# Patient Record
Sex: Female | Born: 1995 | Race: Black or African American | Hispanic: No | Marital: Single | Smoking: Current some day smoker
Health system: Southern US, Community
[De-identification: ages and names within clinical notes are randomized; demographics above are authoritative.]

---
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
## 2017-09-18 ENCOUNTER — Emergency Department (HOSPITAL_COMMUNITY)
Admission: EM | Admit: 2017-09-18 | Discharge: 2017-09-18 | Disposition: A | Discharge status: Home / Self Care | Payer: Self-pay | Attending: Emergency Medicine | Admitting: Emergency Medicine

## 2017-09-18 ENCOUNTER — Encounter (HOSPITAL_COMMUNITY): Payer: Self-pay | Admitting: *Deleted

## 2017-09-18 ENCOUNTER — Other Ambulatory Visit: Payer: Self-pay

## 2017-09-18 DIAGNOSIS — R0981 Nasal congestion: Secondary | ICD-10-CM | POA: Insufficient documentation

## 2017-09-18 DIAGNOSIS — R42 Dizziness and giddiness: Secondary | ICD-10-CM | POA: Insufficient documentation

## 2017-09-18 DIAGNOSIS — R110 Nausea: Secondary | ICD-10-CM | POA: Insufficient documentation

## 2017-09-18 DIAGNOSIS — J3489 Other specified disorders of nose and nasal sinuses: Secondary | ICD-10-CM | POA: Insufficient documentation

## 2017-09-18 DIAGNOSIS — J029 Acute pharyngitis, unspecified: Secondary | ICD-10-CM | POA: Insufficient documentation

## 2017-09-18 DIAGNOSIS — R0989 Other specified symptoms and signs involving the circulatory and respiratory systems: Secondary | ICD-10-CM | POA: Insufficient documentation

## 2017-09-18 MED ORDER — PSEUDOEPHEDRINE HCL 30 MG PO TABS: 30.0000 mg | ORAL_TABLET | ORAL | 0 refills | Status: AC | PRN

## 2017-09-18 NOTE — Discharge Instructions (Signed)
Your exam was reassuring today.   Please drink plenty of fluids at home and get rest this weekend. I have written you a prescription for Sudafed to help with your congestion.   Return to the ER if you have vomiting that will not stop, develop a fever or pass out.

## 2017-09-18 NOTE — ED Triage Notes (Signed)
Pt states upper resp symptoms x 1 week.  STates today began feeling nauseated at work, so came here.

## 2017-09-18 NOTE — ED Provider Notes (Signed)
[facility] Provider Note   CSN: [id number] Arrival date & time: 09/18/17  1248     History   Chief Complaint Chief Complaint  Patient presents with  . URI  . Nausea    HPI Melissa Estes is a 22 y.o. female.  HPI   Melissa Estes is a 22 year old female with no ischemic past medical history who presents to the emergency department for evaluation of runny nose nausea and lightheadedness.  Patient states that she has had a cold for the past five days.  Endorses nasal congestion and rhinorrhea with a mild scratchy sore throat.  She had a headache initially, but that has since resolved.  States that today while at work he felt lightheaded and nauseated.  Sat down and the symptoms resolved within a couple of minutes.  States that she has not had anything to eat today and had a [address], no other fluids.  At this time she denies fever, chills, headache, body aches, dysphagia, cough, shortness of breath, chest pain, abdominal pain, nausea/vomiting, lightheadedness, dizziness, dysuria.  States that she is sexually active with one female partner, denies regular condom use.  She declines pregnancy testing.   History reviewed. No pertinent past medical history.  There are no active problems to display for this patient.   History reviewed. No pertinent surgical history.  OB History    No data available       Home Medications    Prior to Admission medications   Medication Sig Start Date End Date Taking? Authorizing Provider  HYDROcodone-acetaminophen (NORCO/VICODIN) 5-325 MG tablet Take 2 tablets by mouth every 6 (six) hours as needed. Patient not taking: Reported on 05/30/2016 04/15/16   Arvilla MeresMeyer, Ashley L, PA-C  penicillin v potassium (VEETID) 500 MG tablet Take 1 tablet (500 mg total) by mouth 4 (four) times daily. Patient not taking: Reported on 05/30/2016 04/15/16   Deborha PaymentMeyer, Ashley L, PA-C  PRESCRIPTION MEDICATION Take 1 tablet by mouth daily. Patient  is on birth control but does not know the name,[facility] and couldn't get an  answer    [provider]    Family History No family history on file.  Social History Social History   Tobacco Use  . Smoking status: Never Smoker  . Smokeless tobacco: Never Used  Substance Use Topics  . Alcohol use: No  . Drug use: No     Allergies   Patient has no known allergies.   Review of Systems Review of Systems  Constitutional: Negative for chills and fever.  HENT: Positive for congestion, rhinorrhea and sore throat. Negative for ear pain, sinus pressure, sinus pain and trouble swallowing.   Eyes: Negative for visual disturbance.  Respiratory: Negative for cough and shortness of breath.   Cardiovascular: Negative for chest pain.  Gastrointestinal: Negative for abdominal pain, nausea and vomiting.  Genitourinary: Negative for dysuria and frequency.  Musculoskeletal: Negative for gait problem and myalgias.  Skin: Negative for rash.  Neurological: Negative for dizziness, syncope, light-headedness, numbness and headaches.     Physical Exam Updated Vital Signs BP (!) 144/109   Pulse 81   Temp 97.9 F (36.6 C) (Oral)   Resp 17   Ht 5\' 8"  (1.727 m)   Wt 59 kg (130 lb)   LMP 09/10/2017   SpO2 100%   BMI 19.77 kg/m   Physical Exam  Constitutional: She is oriented to person, place, and time. She appears well-developed and well-nourished. No distress.  HENT:  Head: Normocephalic and atraumatic.  Bilateral TMs with good cone of light.  No tenderness over maxillary or frontal sinuses.  Clear rhinorrhea in bilateral nares.  Mucous membranes moist.  Posterior oropharynx without erythema.  No tonsillar edema or exudate.  Uvula midline.  Airway patent.  Eyes: Conjunctivae and EOM are normal. Pupils are equal, round, and reactive to light. Right eye exhibits no discharge. Left eye exhibits no discharge.  Neck: Normal range of motion. Neck supple.    Cardiovascular: Normal rate, regular rhythm and intact distal pulses. Exam reveals no friction rub.  No murmur heard. Pulmonary/Chest: Effort normal. No stridor. No respiratory distress. She has no wheezes. She has no rales.  Abdominal: Soft. Bowel sounds are normal. There is no tenderness. There is no guarding.  Musculoskeletal: Normal range of motion.  Lymphadenopathy:    She has no cervical adenopathy.  Neurological: She is alert and oriented to person, place, and time. Coordination normal.  Mental Status:  Alert, oriented, thought content appropriate, able to give a coherent history. Speech fluent without evidence of aphasia. Able to follow 2 step commands without difficulty.  Cranial Nerves:  II:  Peripheral visual fields grossly normal, pupils equal, round, reactive to light III,IV, VI: ptosis not present, extra-ocular motions intact bilaterally  V,VII: smile symmetric, facial light touch sensation equal VIII: hearing grossly normal to voice  X: uvula elevates symmetrically  XI: bilateral shoulder shrug symmetric and strong XII: midline tongue extension without fassiculations Motor:  Normal tone. 5/5 in upper and lower extremities bilaterally including strong and equal grip strength and dorsiflexion/plantar flexion Sensory: Pinprick and light touch normal in all extremities.  Deep Tendon Reflexes: 2+ and symmetric in the biceps and patella Cerebellar: normal finger-to-nose with bilateral upper extremities Gait: normal gait and balance  Skin: Skin is warm and dry. She is not diaphoretic.  Psychiatric: She has a normal mood and affect. Her behavior is normal.  Nursing note and vitals reviewed.    ED Treatments / Results  Labs (all labs ordered are listed, but only abnormal results are displayed) Labs Reviewed - No data to display  EKG  EKG Interpretation None       Radiology No results found.  Procedures Procedures (including critical care time)  Medications  Ordered in ED Medications - No data to display   Initial Impression / Assessment and Plan / ED Course  I have reviewed the triage vital signs and the nursing notes.  Pertinent labs & imaging results that were available during my care of the patient were reviewed by me and considered in my medical decision making (see chart for details).    Presentation consistent with viral URI.  She is afebrile and nontoxic-appearing.  Vital signs stable.  Lungs clear to auscultation, no concern for pneumonia.  Suspect that her feeling of lightheadedness at work was related to being dehydrated given that she only had a cup of [facility] today.  She denies feeling lightheaded or dizzy at this time.  Is able to ambulate independently without difficulty.  She has no neurological deficits.  Although she had nausea earlier, she denies nausea at this time and is able to tolerate p.o. fluids at the bedside. Have counseled patient on symptomatic management of her runny nose with sudafed decongestant.  Her blood pressure was mildly elevated in the ER, counseled her to have this rechecked. Counseled patient on return precautions and she agrees and voiced understanding to the plan.  Final Clinical Impressions(s) / ED Diagnoses  Final diagnoses:  Lightheadedness    ED Discharge Orders        Ordered    pseudoephedrine (SUDAFED) 30 MG tablet  Every 4 hours PRN     09/18/17 1536       Lawrence Marseilles 09/18/17 1536    Donnetta Hutching, MD 09/19/17 [phone]

## 2018-04-16 ENCOUNTER — Encounter (HOSPITAL_COMMUNITY): Payer: Self-pay

## 2018-04-16 ENCOUNTER — Emergency Department (HOSPITAL_COMMUNITY)
Admission: EM | Admit: 2018-04-16 | Discharge: 2018-04-16 | Disposition: A | Discharge status: Home / Self Care | Payer: Medicaid Other | Attending: Emergency Medicine | Admitting: Emergency Medicine

## 2018-04-16 ENCOUNTER — Other Ambulatory Visit: Payer: Self-pay

## 2018-04-16 DIAGNOSIS — F17200 Nicotine dependence, unspecified, uncomplicated: Secondary | ICD-10-CM | POA: Diagnosis not present

## 2018-04-16 DIAGNOSIS — R21 Rash and other nonspecific skin eruption: Secondary | ICD-10-CM | POA: Diagnosis not present

## 2018-04-16 MED ORDER — TRIAMCINOLONE ACETONIDE 0.1 % EX CREA: 1.0000 "application " | TOPICAL_CREAM | Freq: Two times a day (BID) | CUTANEOUS | 0 refills | Status: AC

## 2018-04-16 NOTE — ED Triage Notes (Signed)
Patient presented to the ed with c/o rash all over the body that started last Thursday. Pt state it is itch really bad.

## 2018-04-16 NOTE — ED Provider Notes (Signed)
[facility]-EMERGENCY DEPT Provider Note   CSN: [id number] Arrival date & time: 04/16/18  1047     History   Chief Complaint No chief complaint on file.   HPI Melissa Estes is a 22 y.o. female.  HPI   Patient is a 22 year old female presents emergency department today complaining of a rash to her trunk, legs and arms that has been present for the last week.  States that rash started out with a small patch to her abdomen and then spread to the area as mentioned above.  Rash is pruritic.  Not painful or vesicular.  No fevers, chills, body aches, headaches, or other symptoms to accompany the rash.  States she was seen at student [facility] and was advised to use hydrocortisone cream.  She has had no improvement of the rash since using this.  She is also tried Aveeno as she states that that is what she used when she has had eczema in the past.  This did not improve her symptoms.  She states her rash is not consistent with eczema.  Denies any new foods, medications, lotions, soaps, sprays, or pets.  States she has checked her apartment for bedbugs and did not find any.  History reviewed. No pertinent past medical history.  There are no active problems to display for this patient.   History reviewed. No pertinent surgical history.   OB History   None      Home Medications    Prior to Admission medications   Medication Sig Start Date End Date Taking? Authorizing Provider  HYDROcodone-acetaminophen (NORCO/VICODIN) 5-325 MG tablet Take 2 tablets by mouth every 6 (six) hours as needed. Patient not taking: Reported on 05/30/2016 04/15/16   Arvilla MeresMeyer, Ashley L, PA-C  penicillin v potassium (VEETID) 500 MG tablet Take 1 tablet (500 mg total) by mouth 4 (four) times daily. Patient not taking: Reported on 05/30/2016 04/15/16   Deborha PaymentMeyer, Ashley L, PA-C  PRESCRIPTION MEDICATION Take 1 tablet by mouth daily. Patient is on birth control but does not know the name,[facility] we called and couldn't get an  answer    [provider]  pseudoephedrine (SUDAFED) 30 MG tablet Take 1 tablet (30 mg total) by mouth every 4 (four) hours as needed for congestion. 09/18/17   Kellie ShropshireShrosbree, Emily J, PA-C  triamcinolone cream (KENALOG) 0.1 % Apply 1 application topically 2 (two) times daily. 04/16/18   Kemaria Dedic S, PA-C    Family History No family history on file.  Social History Social History   Tobacco Use  . Smoking status: Current Some Day Smoker  . Smokeless tobacco: Never Used  Substance Use Topics  . Alcohol use: No  . Drug use: No     Allergies   Patient has no known allergies.   Review of Systems Review of Systems  Constitutional: Negative for fever.  HENT:       No angioedema  Eyes: Negative for visual disturbance.  Respiratory: Negative for shortness of breath.   Cardiovascular: Negative for chest pain.  Gastrointestinal: Negative for abdominal pain, nausea and vomiting.  Musculoskeletal: Negative for back pain.  Skin: Positive for rash.  Neurological: Negative for headaches.     Physical Exam Updated Vital Signs BP 111/71 (BP Location: Right Arm)   Pulse 76   Temp 98.3 F (36.8 C) (Oral)   Resp 14   Ht 5\' 8"  (1.727 m)   Wt 56.7 kg   LMP 03/25/2018   SpO2 100%  BMI 19.01 kg/m   Physical Exam  Constitutional: She appears well-developed and well-nourished. No distress.  HENT:  Head: Normocephalic and atraumatic.  Eyes: Conjunctivae are normal.  Neck: Neck supple.  Cardiovascular: Normal rate, regular rhythm and normal heart sounds.  Pulmonary/Chest: Effort normal and breath sounds normal. She has no wheezes.  Musculoskeletal: Normal range of motion.  Neurological: She is alert.  Skin: Skin is warm and dry. Rash noted.  Multiple 0.5 to 1 cm hyperpigmented patches noted diffusely to the trunk, proximal bilateral lower extremities and proximal bilateral upper extremities.  No vesicular lesions.  No evidence of  superinfection.  Psychiatric: She has a normal mood and affect.  Nursing note and vitals reviewed.    ED Treatments / Results  Labs (all labs ordered are listed, but only abnormal results are displayed) Labs Reviewed - No data to display  EKG None  Radiology No results found.  Procedures Procedures (including critical care time)  Medications Ordered in ED Medications - No data to display   Initial Impression / Assessment and Plan / ED Course  I have reviewed the triage vital signs and the nursing notes.  Pertinent labs & imaging results that were available during my care of the patient were reviewed by me and considered in my medical decision making (see chart for details).     Final Clinical Impressions(s) / ED Diagnoses   Final diagnoses:  Rash and nonspecific skin eruption   Rash consistent with pityriasis rosea. Patient denies any difficulty breathing or swallowing.  Pt has a patent airway without stridor and is handling secretions without difficulty; no angioedema. No blisters, no pustules, no warmth, no draining sinus tracts, no superficial abscesses, no bullous impetigo, no vesicles, no desquamation, no target lesions with dusky purpura or a central bulla. Not tender to touch. No concern for superimposed infection. No concern for SJS, TEN, TSS, tick borne illness, syphilis or other life-threatening condition. Will discharge home with triamcinolone steroid cream. Have advised her to take Claritin/benadryl as needed for itching. Advised to f/u with student health in 1-2 weeks for re-eval and to return if worse. Pt voices understanding of plan and reasons to return. All questions answered.   ED Discharge Orders         Ordered    triamcinolone cream (KENALOG) 0.1 %  2 times daily     04/16/18 [address] Denton Derks S, PA-C 04/16/18 1243    Shaune Pollack, MD 04/16/18 1920

## 2018-04-16 NOTE — ED Notes (Signed)
Bed: WTR5 Expected date:  Expected time:  Means of arrival:  Comments: 

## 2018-04-16 NOTE — Discharge Instructions (Addendum)
Please use the steroid cream as directed on your discharge paperwork.    Please make an appointment with student health in 1 week for reevaluation.    Please return to the ER for any new or worsening symptoms including fevers, chills, body aches, worsening of your rash.

## 2021-06-01 ENCOUNTER — Emergency Department (HOSPITAL_BASED_OUTPATIENT_CLINIC_OR_DEPARTMENT_OTHER)
Admission: EM | Admit: 2021-06-01 | Discharge: 2021-06-02 | Disposition: A | Discharge status: Home / Self Care | Payer: No Typology Code available for payment source | Attending: Emergency Medicine | Admitting: Emergency Medicine

## 2021-06-01 ENCOUNTER — Emergency Department (HOSPITAL_BASED_OUTPATIENT_CLINIC_OR_DEPARTMENT_OTHER): Payer: No Typology Code available for payment source

## 2021-06-01 ENCOUNTER — Other Ambulatory Visit: Payer: Self-pay

## 2021-06-01 ENCOUNTER — Encounter (HOSPITAL_BASED_OUTPATIENT_CLINIC_OR_DEPARTMENT_OTHER): Payer: Self-pay | Admitting: Emergency Medicine

## 2021-06-01 DIAGNOSIS — F17200 Nicotine dependence, unspecified, uncomplicated: Secondary | ICD-10-CM | POA: Diagnosis not present

## 2021-06-01 DIAGNOSIS — M25531 Pain in right wrist: Secondary | ICD-10-CM | POA: Diagnosis not present

## 2021-06-01 DIAGNOSIS — R519 Headache, unspecified: Secondary | ICD-10-CM | POA: Insufficient documentation

## 2021-06-01 DIAGNOSIS — Y92410 Unspecified street and highway as the place of occurrence of the external cause: Secondary | ICD-10-CM | POA: Insufficient documentation

## 2021-06-01 DIAGNOSIS — S169XXA Unspecified injury of muscle, fascia and tendon at neck level, initial encounter: Secondary | ICD-10-CM | POA: Diagnosis present

## 2021-06-01 DIAGNOSIS — T1490XA Injury, unspecified, initial encounter: Secondary | ICD-10-CM

## 2021-06-01 DIAGNOSIS — S161XXA Strain of muscle, fascia and tendon at neck level, initial encounter: Secondary | ICD-10-CM | POA: Diagnosis not present

## 2021-06-01 DIAGNOSIS — R109 Unspecified abdominal pain: Secondary | ICD-10-CM | POA: Diagnosis not present

## 2021-06-01 LAB — CBC
HCT: 37.8 % (ref 36.0–46.0)
Hemoglobin: 12.6 g/dL (ref 12.0–15.0)
MCH: 30 pg (ref 26.0–34.0)
MCHC: 33.3 g/dL (ref 30.0–36.0)
MCV: 90 fL (ref 80.0–100.0)
Platelets: 206 10*3/uL (ref 150–400)
RBC: 4.2 MIL/uL (ref 3.87–5.11)
RDW: 12.3 % (ref 11.5–15.5)
WBC: 9.7 10*3/uL (ref 4.0–10.5)
nRBC: 0 % (ref 0.0–0.2)

## 2021-06-01 LAB — URINALYSIS, ROUTINE W REFLEX MICROSCOPIC
Bilirubin Urine: NEGATIVE
Glucose, UA: NEGATIVE mg/dL
Hgb urine dipstick: NEGATIVE
Ketones, ur: NEGATIVE mg/dL
Leukocytes,Ua: NEGATIVE
Nitrite: NEGATIVE
Specific Gravity, Urine: 1.023 (ref 1.005–1.030)
pH: 5.5 (ref 5.0–8.0)

## 2021-06-01 LAB — BASIC METABOLIC PANEL
Anion gap: 7 (ref 5–15)
BUN: 13 mg/dL (ref 6–20)
CO2: 27 mmol/L (ref 22–32)
Calcium: 9.2 mg/dL (ref 8.9–10.3)
Chloride: 104 mmol/L (ref 98–111)
Creatinine, Ser: 0.7 mg/dL (ref 0.44–1.00)
GFR, Estimated: >60 mL/min (ref >60)
Glucose, Bld: 69 mg/dL — ABNORMAL LOW (ref 70–99)
Potassium: 3.8 mmol/L (ref 3.5–5.1)
Sodium: 138 mmol/L (ref 135–145)

## 2021-06-01 LAB — PREGNANCY, URINE: Preg Test, Ur: NEGATIVE

## 2021-06-01 MED ORDER — IOHEXOL 350 MG/ML SOLN
60.0000 mL | Freq: Once | INTRAVENOUS | Status: AC | PRN
  Administered 2021-06-01: 60 mL via INTRAVENOUS
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
## 2021-06-02 MED ORDER — NAPROXEN 500 MG PO TABS: 500.0000 mg | ORAL_TABLET | Freq: Two times a day (BID) | ORAL | 0 refills | Status: AC

## 2021-06-02 NOTE — Discharge Instructions (Addendum)
Work note provided.  Take the Naprosyn as needed for pain.  Expect to be sore and stiff for the next few days.  Work-up without any acute injury findings. Follow up with Dr. Izora Ribas to recheck your wrist.

## 2021-06-11 ENCOUNTER — Emergency Department (HOSPITAL_BASED_OUTPATIENT_CLINIC_OR_DEPARTMENT_OTHER)
Admission: EM | Admit: 2021-06-11 | Discharge: 2021-06-11 | Disposition: A | Discharge status: Home / Self Care | Payer: PRIVATE HEALTH INSURANCE | Attending: Emergency Medicine | Admitting: Emergency Medicine

## 2021-06-11 ENCOUNTER — Other Ambulatory Visit: Payer: Self-pay

## 2021-06-11 ENCOUNTER — Encounter (HOSPITAL_BASED_OUTPATIENT_CLINIC_OR_DEPARTMENT_OTHER): Payer: Self-pay | Admitting: Emergency Medicine

## 2021-06-11 DIAGNOSIS — F17200 Nicotine dependence, unspecified, uncomplicated: Secondary | ICD-10-CM | POA: Diagnosis not present

## 2021-06-11 DIAGNOSIS — M62830 Muscle spasm of back: Secondary | ICD-10-CM | POA: Diagnosis present

## 2021-06-11 DIAGNOSIS — M791 Myalgia, unspecified site: Secondary | ICD-10-CM | POA: Diagnosis not present

## 2021-06-11 MED ORDER — METHOCARBAMOL 500 MG PO TABS: 500.0000 mg | ORAL_TABLET | Freq: Two times a day (BID) | ORAL | 0 refills | Status: AC
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
## 2021-11-18 ENCOUNTER — Emergency Department (HOSPITAL_BASED_OUTPATIENT_CLINIC_OR_DEPARTMENT_OTHER)
Admission: EM | Admit: 2021-11-18 | Discharge: 2021-11-18 | Disposition: A | Discharge status: Home / Self Care | Payer: BC Managed Care – PPO | Attending: Emergency Medicine | Admitting: Emergency Medicine

## 2021-11-18 ENCOUNTER — Encounter (HOSPITAL_BASED_OUTPATIENT_CLINIC_OR_DEPARTMENT_OTHER): Payer: Self-pay

## 2021-11-18 ENCOUNTER — Other Ambulatory Visit: Payer: Self-pay

## 2021-11-18 ENCOUNTER — Emergency Department (HOSPITAL_BASED_OUTPATIENT_CLINIC_OR_DEPARTMENT_OTHER): Payer: BC Managed Care – PPO

## 2021-11-18 DIAGNOSIS — S63501A Unspecified sprain of right wrist, initial encounter: Secondary | ICD-10-CM | POA: Diagnosis not present

## 2021-11-18 DIAGNOSIS — S6991XA Unspecified injury of right wrist, hand and finger(s), initial encounter: Secondary | ICD-10-CM | POA: Diagnosis present

## 2021-11-18 DIAGNOSIS — W228XXA Striking against or struck by other objects, initial encounter: Secondary | ICD-10-CM | POA: Insufficient documentation

## 2021-11-18 DIAGNOSIS — S60221A Contusion of right hand, initial encounter: Secondary | ICD-10-CM

## 2021-11-18 MED ORDER — KETOROLAC TROMETHAMINE 30 MG/ML IJ SOLN
30.0000 mg | Freq: Once | INTRAMUSCULAR | Status: AC
  Administered 2021-11-18: 30 mg via INTRAMUSCULAR
  Filled 2021-11-18: qty 1
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
## 2021-11-18 NOTE — ED Notes (Signed)
Pt verbalizes understanding of discharge instructions. Opportunity for questioning and answers were provided. Pt discharged from ED to home.   ? ?

## 2021-11-18 NOTE — Discharge Instructions (Signed)
You were seen today for injury to the right hand.  Your x-rays do not show any evidence of fracture or breaks.  You likely have a contusion.  You will be placed in a wrist splint for support.  Ice and elevate.  Take ibuprofen as needed for pain. ?
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
## 2023-09-12 IMAGING — DX DG HAND COMPLETE 3+V*R*
3 series · 3 of 3 positions shown · non-contrast
Comparison: None.

CLINICAL DATA: Right hand pain

EXAM:
RIGHT HAND - COMPLETE 3+ VIEW

[hand ap]
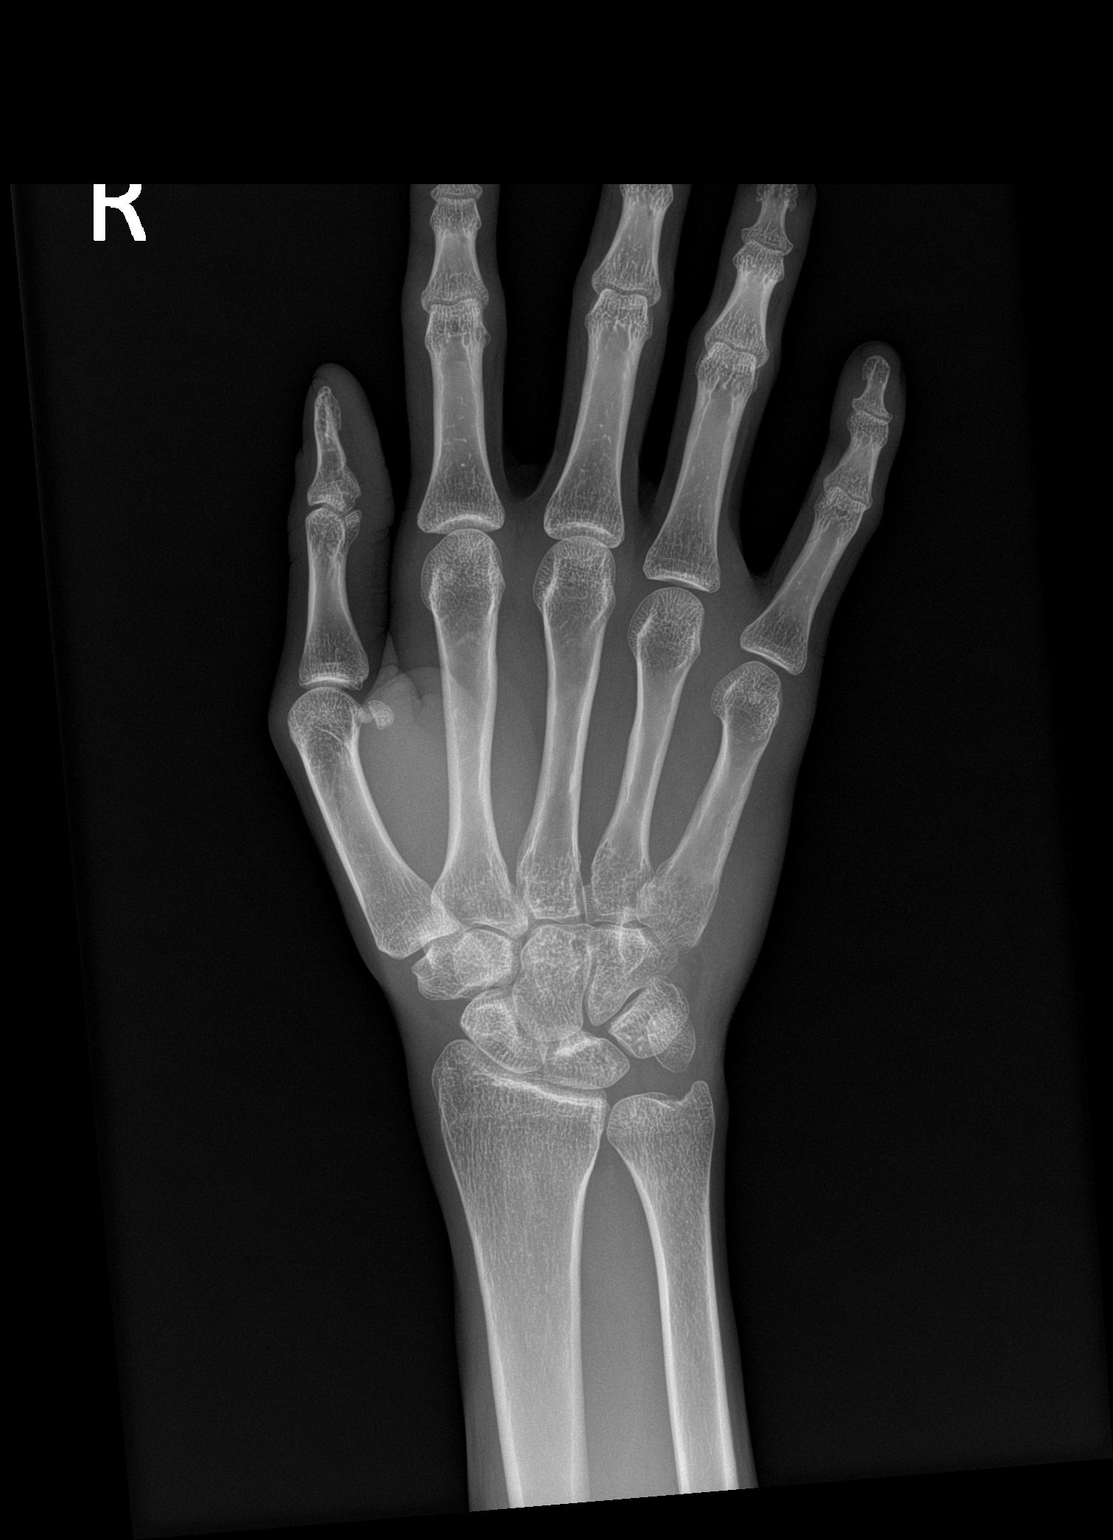

[hand obl]
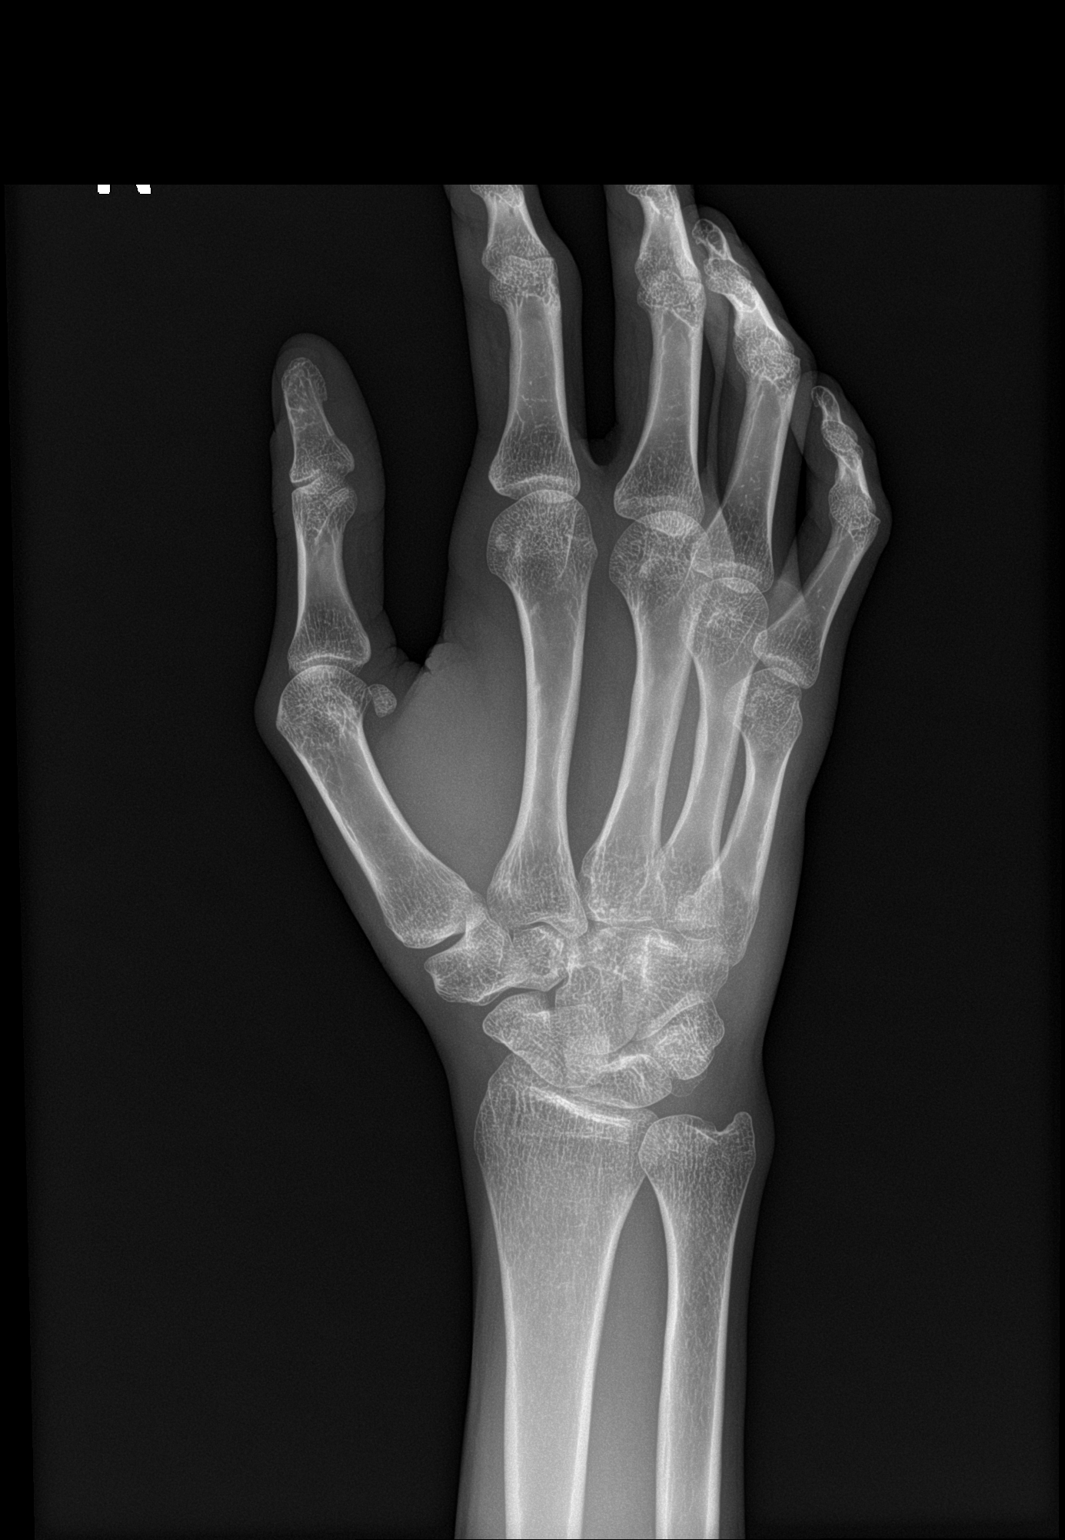

[hand lat]
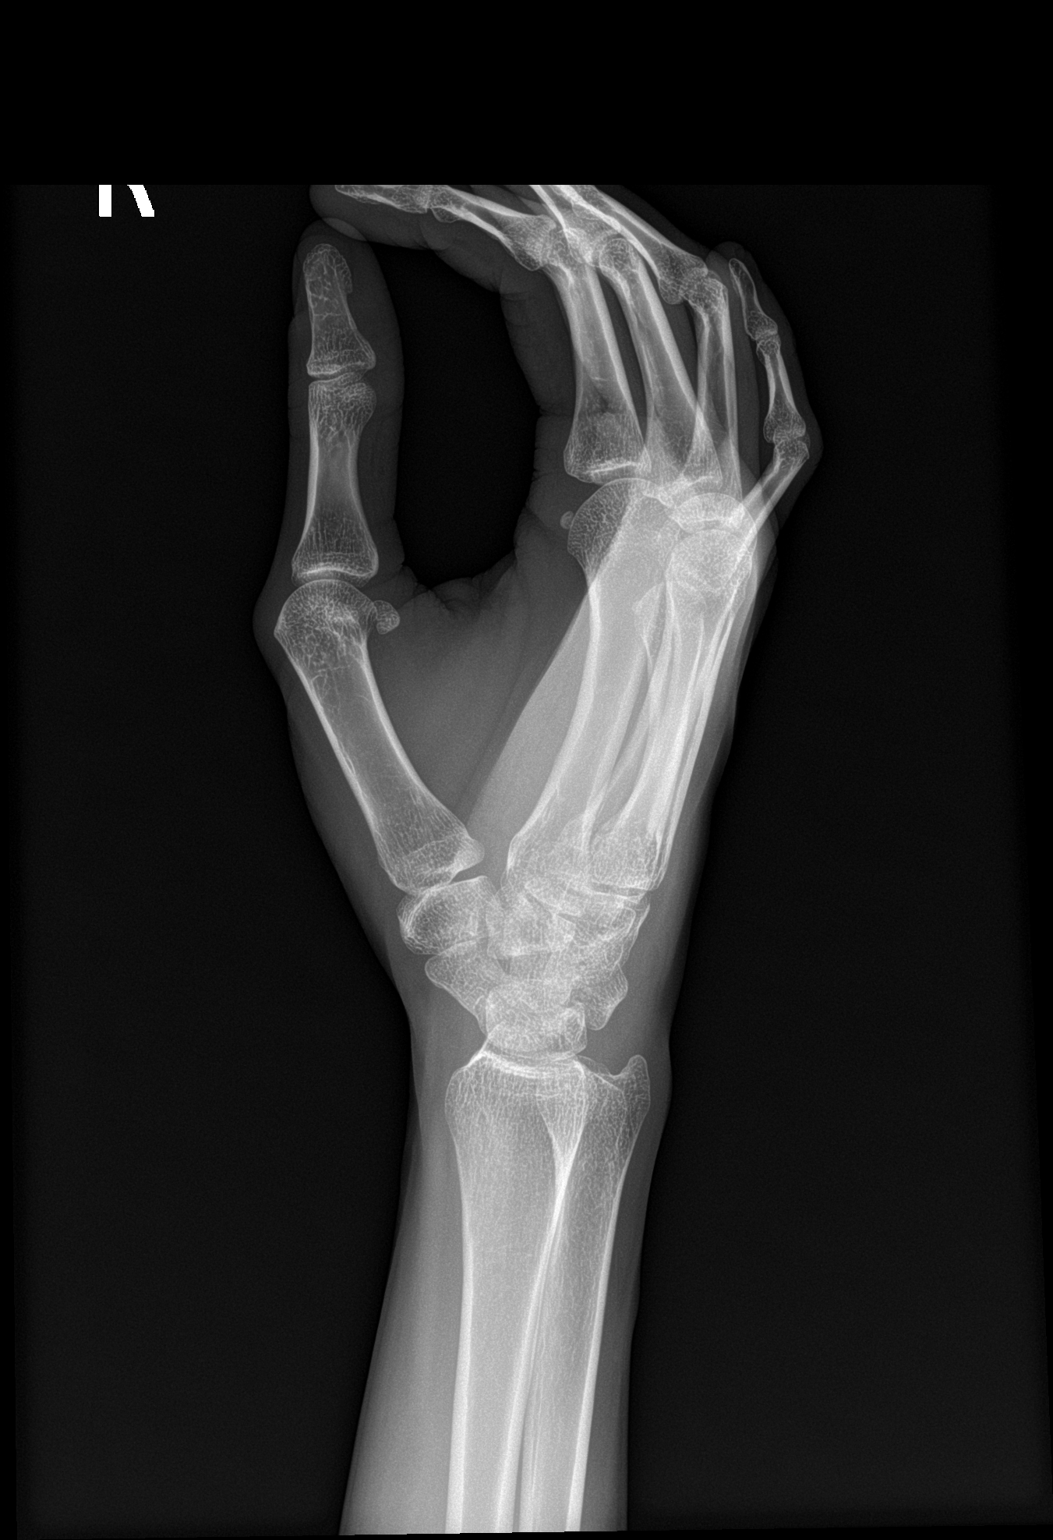

[3 of 3 positions shown; findings below may reference images not displayed]

FINDINGS: No fracture or dislocation is seen.

The joint spaces are preserved.

Visualized soft tissues are within normal limits.
IMPRESSION: Negative.

## 2023-12-26 ENCOUNTER — Encounter (HOSPITAL_BASED_OUTPATIENT_CLINIC_OR_DEPARTMENT_OTHER): Payer: Self-pay

## 2023-12-26 ENCOUNTER — Emergency Department (HOSPITAL_BASED_OUTPATIENT_CLINIC_OR_DEPARTMENT_OTHER)
Admission: EM | Admit: 2023-12-26 | Discharge: 2023-12-26 | Disposition: A | Discharge status: Home / Self Care | Attending: Emergency Medicine | Admitting: Emergency Medicine

## 2023-12-26 ENCOUNTER — Other Ambulatory Visit: Payer: Self-pay

## 2023-12-26 DIAGNOSIS — R102 Pelvic and perineal pain: Secondary | ICD-10-CM | POA: Diagnosis not present

## 2023-12-26 DIAGNOSIS — R1032 Left lower quadrant pain: Secondary | ICD-10-CM | POA: Diagnosis present

## 2023-12-26 LAB — URINALYSIS, ROUTINE W REFLEX MICROSCOPIC
Bilirubin Urine: NEGATIVE
Glucose, UA: NEGATIVE mg/dL
Hgb urine dipstick: NEGATIVE
Ketones, ur: NEGATIVE mg/dL
Leukocytes,Ua: NEGATIVE
Nitrite: NEGATIVE
Specific Gravity, Urine: 1.028 (ref 1.005–1.030)
pH: 5.5 (ref 5.0–8.0)

## 2023-12-26 LAB — PREGNANCY, URINE: Preg Test, Ur: NEGATIVE

## 2023-12-26 MED ORDER — LACTATED RINGERS IV BOLUS: 1000.0000 mL | Freq: Once | INTRAVENOUS | Status: DC

## 2023-12-26 MED ORDER — KETOROLAC TROMETHAMINE 15 MG/ML IJ SOLN
15.0000 mg | Freq: Once | INTRAMUSCULAR | Status: AC
  Administered 2023-12-26: 15 mg via INTRAMUSCULAR
  Filled 2023-12-26: qty 1

## 2023-12-26 MED ORDER — CELECOXIB 200 MG PO CAPS: 200.0000 mg | ORAL_CAPSULE | Freq: Two times a day (BID) | ORAL | 0 refills | Status: AC

## 2023-12-26 MED ORDER — ACETAMINOPHEN 500 MG PO TABS
1000.0000 mg | ORAL_TABLET | Freq: Once | ORAL | Status: AC
  Administered 2023-12-26: 1000 mg via ORAL
  Filled 2023-12-26: qty 2

## 2023-12-26 NOTE — ED Provider Notes (Signed)
 [place] EMERGENCY DEPARTMENT AT [facility] Provider Note   CSN: [id number] Arrival date & time: 12/26/23  0401     History Chief Complaint  Patient presents with   Abdominal Pain    HPI Melissa Estes is a 28 y.o. female presenting for chief complaint of abdominal pain. States that she has been having LLQ pain.  Recent new sexual contact.  Denies any vaginal bleeding vaginal discharge, fevers chills, flank pain or any other symptoms at this time.  Currently she is asymptomatic.  States that it feels like menstrual cycle.  Patient's recorded medical, surgical, social, medication list and allergies were reviewed in the Snapshot window as part of the initial history.   Review of Systems   Review of Systems  Constitutional:  Negative for chills and fever.  HENT:  Negative for ear pain and sore throat.   Eyes:  Negative for pain and visual disturbance.  Respiratory:  Negative for cough and shortness of breath.   Cardiovascular:  Negative for chest pain and palpitations.  Gastrointestinal:  Negative for abdominal pain and vomiting.  Genitourinary:  Negative for dysuria and hematuria.  Musculoskeletal:  Negative for arthralgias and back pain.  Skin:  Negative for color change and rash.  Neurological:  Negative for seizures and syncope.  All other systems reviewed and are negative.   Physical Exam Updated Vital Signs BP 117/84 (BP Location: Right Arm)   Pulse 68   Temp 97.7 F (36.5 C) (Oral)   Resp 16   Ht 5\' 7"  (1.702 m)   Wt 52.2 kg   SpO2 100%   BMI 18.01 kg/m  Physical Exam Vitals and nursing note reviewed.  Constitutional:      General: She is not in acute distress.    Appearance: She is well-developed.  HENT:     Head: Normocephalic and atraumatic.  Eyes:     Conjunctiva/sclera: Conjunctivae normal.  Cardiovascular:     Rate and Rhythm: Normal rate and regular rhythm.     Heart sounds: No murmur heard. Pulmonary:     Effort: Pulmonary effort is  normal. No respiratory distress.     Breath sounds: Normal breath sounds.  Abdominal:     General: There is no distension.     Palpations: Abdomen is soft.     Tenderness: There is no abdominal tenderness. There is no right CVA tenderness or left CVA tenderness.  Musculoskeletal:        General: No swelling or tenderness. Normal range of motion.     Cervical back: Neck supple.  Skin:    General: Skin is warm and dry.  Neurological:     General: No focal deficit present.     Mental Status: She is alert and oriented to person, place, and time. Mental status is at baseline.     Cranial Nerves: No cranial nerve deficit.      ED Course/ Medical Decision Making/ A&P    Procedures Procedures   Medications Ordered in ED Medications  acetaminophen  (TYLENOL ) tablet 1,000 mg (1,000 mg Oral Given 12/26/23 0449)  ketorolac  (TORADOL ) 15 MG/ML injection 15 mg (15 mg Intramuscular Given 12/26/23 0449)    Medical Decision Making:   Melissa Estes is a 28 y.o. female who presented to the ED today with mild pelvic pain detailed above.    Complete initial physical exam performed, notably the patient  was hemodynamically stable no acute distress.  No abdominal tenderness on my exam.    Reviewed and confirmed nursing documentation for  past medical history, family history, social history.    Initial Assessment:   With the patient's presentation of pelvic pain, most likely diagnosis is nonspecific etiology. Other diagnoses were considered including (but not limited to) ovarian torsion, appendicitis, cholecystitis, small bowel obstruction, urinary tract infection, ectopic pregnancy. These are considered less likely due to history of present illness and physical exam findings.   This is most consistent with an acute complicated illness Overall, patient is very well-appearing.  She has no focal tenderness on my exam, she is ambulatory tolerating p.o. intake.  She states that her menstrual cycle ended  yesterday and this feels similar to the cramping she experiences during her menstrual cycle. She states she recently had a new sexual contact and the sex was relatively more aggressive than she was used to.  Initial Plan:  Urinalysis with reflex culture ordered to evaluate for UTI or relevant urologic/nephrologic pathology.  Patient wanted urinary testing for STIs, did not want blood work at this time Pregnancy test Objective evaluation as below reviewed   Initial Study Results:   Laboratory  All laboratory results reviewed without evidence of clinically relevant pathology.     Reassessment and Plan:   Patient's physical exam remains benign after an hour of observation.  She was treated with Tylenol  and NSAIDs.  She is not pregnant.  Urinalysis without focal pathology.  Urine GC to be followed up asynchronously.  Will not treat empirically as patient is asymptomatic.  Also notably, pain very mild on exam, ovarian torsion considered grossly inconsistent with current presentation though we discussed possibility of cyst and need for return if her symptoms worsen.   Patient to follow-up with her gynecologist in the outpatient setting ambulatory tolerating p.o. intake at this time stable for outpatient care management  Clinical Impression:  1. Pelvic pain      Discharge   Final Clinical Impression(s) / ED Diagnoses Final diagnoses:  Pelvic pain    Rx / DC Orders ED Discharge Orders          Ordered    celecoxib (CELEBREX) 200 MG capsule  2 times daily        12/26/23 0438              Onetha Bile, MD 12/26/23 [phone]

## 2023-12-26 NOTE — ED Triage Notes (Signed)
 Pt reports left lower abdominal pain radiating around to flank. Pt reports pain ongoing for past 2 weeks and has been intermittent. Pain is stabbing, came on gradually. No aggravating/alleviating factors noted. Pt reports pain got worse during her menstrual cycle and then stuck around after cycle was finished. Pain is different than normal cramping per patient. Pt reports nausea and diarrhea. Denies vomiting/fever. Pt awake and alert and in no distress in triage.

## 2023-12-28 LAB — GC/CHLAMYDIA PROBE AMP (~~LOC~~) NOT AT ARMC
Chlamydia: NEGATIVE
Comment: NEGATIVE
Comment: NORMAL
Neisseria Gonorrhea: NEGATIVE

## 2024-08-05 ENCOUNTER — Emergency Department (HOSPITAL_BASED_OUTPATIENT_CLINIC_OR_DEPARTMENT_OTHER)
Admission: EM | Admit: 2024-08-05 | Discharge: 2024-08-06 | Disposition: A | Attending: Emergency Medicine | Admitting: Emergency Medicine

## 2024-08-05 ENCOUNTER — Other Ambulatory Visit: Payer: Self-pay

## 2024-08-05 ENCOUNTER — Encounter (HOSPITAL_BASED_OUTPATIENT_CLINIC_OR_DEPARTMENT_OTHER): Payer: Self-pay | Admitting: Emergency Medicine

## 2024-08-05 DIAGNOSIS — N6310 Unspecified lump in the right breast, unspecified quadrant: Secondary | ICD-10-CM | POA: Diagnosis present

## 2024-08-05 DIAGNOSIS — N6314 Unspecified lump in the right breast, lower inner quadrant: Secondary | ICD-10-CM

## 2024-08-05 NOTE — ED Triage Notes (Addendum)
 Lump painful, right breast. Denies redness, notices swelling. LMP ~12/23. Denies oral contraceptive/hormones base medications.  Denies discharge or nipple changes.

## 2024-08-06 NOTE — Discharge Instructions (Signed)
 Call the breast center Monday morning to arrange a mammogram and further evaluation.  Their number has been included in this discharge summary.

## 2024-08-06 NOTE — ED Provider Notes (Signed)
" °  [place] EMERGENCY DEPARTMENT AT [facility] Provider Note   CSN: [id number] Arrival date & time: 08/05/24  2328     Patient presents with: Breast Mass   Melissa Estes is a 29 y.o. female.   Patient is a 29 year old female presenting with a lump in her right breast.  She reports waking up 2 mornings ago and noticing this.  The area is tender to the touch.  There is no redness or drainage.  No fevers or chills.       Prior to Admission medications  Medication Sig Start Date End Date Taking? Authorizing Provider  celecoxib  (CELEBREX ) 200 MG capsule Take 1 capsule (200 mg total) by mouth 2 (two) times daily. 12/26/23   Jerral Meth, MD  methocarbamol  (ROBAXIN ) 500 MG tablet Take 1 tablet (500 mg total) by mouth 2 (two) times daily. 06/11/21   Roselyn Carlin NOVAK, MD  naproxen  (NAPROSYN ) 500 MG tablet Take 1 tablet (500 mg total) by mouth 2 (two) times daily. 06/02/21   Zackowski, Scott, MD  PRESCRIPTION MEDICATION Take 1 tablet by mouth daily. Patient is on birth control but does not know the name,she gets from [facility] clinic we called and couldn't get an  answer    [provider]  pseudoephedrine  (SUDAFED) 30 MG tablet Take 1 tablet (30 mg total) by mouth every 4 (four) hours as needed for congestion. 09/18/17   Delwyn Damien PARAS, PA-C  triamcinolone  cream (KENALOG ) 0.1 % Apply 1 application topically 2 (two) times daily. 04/16/18   Couture, Cortni S, PA-C    Allergies: Patient has no known allergies.    Review of Systems  All other systems reviewed and are negative.   Updated Vital Signs BP 119/88   Pulse 78   Temp 98.5 F (36.9 C) (Oral)   Resp 16   SpO2 100%   Physical Exam Vitals and nursing note reviewed.  HENT:     Head: Normocephalic.  Pulmonary:     Effort: Pulmonary effort is normal.  Skin:    General: Skin is warm and dry.     Comments: Exam of the right breast reveals a 1 cm nodule in the medial lower quadrant.  It is somewhat tender to  the touch.  There is no fluctuance or erythema of the overlying skin.  Neurological:     Mental Status: She is alert and oriented to person, place, and time.     (all labs ordered are listed, but only abnormal results are displayed) Labs Reviewed - No data to display  EKG: None  Radiology: No results found.   Procedures   Medications Ordered in the ED - No data to display                                  Medical Decision Making  Patient presenting with a lump in her right breast, the etiology of which I am uncertain.  It is somewhat tender to the touch, but I do not feel represents an abscess.  This could potentially be a lymph node or lipoma, but will refer to the breast center for consideration of a mammogram or ultrasound.     Final diagnoses:  None    ED Discharge Orders     None          Geroldine Berg, MD 08/06/24 0007  "

## 2024-08-16 ENCOUNTER — Other Ambulatory Visit: Payer: Self-pay | Admitting: Physician Assistant

## 2024-08-16 DIAGNOSIS — N6310 Unspecified lump in the right breast, unspecified quadrant: Secondary | ICD-10-CM

## 2024-08-24 ENCOUNTER — Inpatient Hospital Stay
Admission: RE | Admit: 2024-08-24 | Discharge: 2024-08-24 | Attending: Physician Assistant | Admitting: Physician Assistant

## 2024-08-24 ENCOUNTER — Encounter

## 2024-08-24 DIAGNOSIS — N6310 Unspecified lump in the right breast, unspecified quadrant: Secondary | ICD-10-CM
